# Patient Record
Sex: Female | Born: 1995 | Race: White | Hispanic: Yes | Marital: Single | Smoking: Never smoker
Health system: Southern US, Community
[De-identification: ages and names within clinical notes are randomized; demographics above are authoritative.]

---
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
## 2017-09-27 IMAGING — CR DG FOOT COMPLETE 3+V*R*
1 series · 3 of 3 positions shown · non-contrast
Comparison: 09/07/2012

CLINICAL DATA: Tripping injury while walking down stairs with
popping sensation, lateral foot pain dorsally with bruising and
swelling.

EXAM:
RIGHT FOOT COMPLETE - 3+ VIEW

[Series 1: dg foot complete right · 0.14mm/px · 3 of 3 slices shown]
[im 1/3]
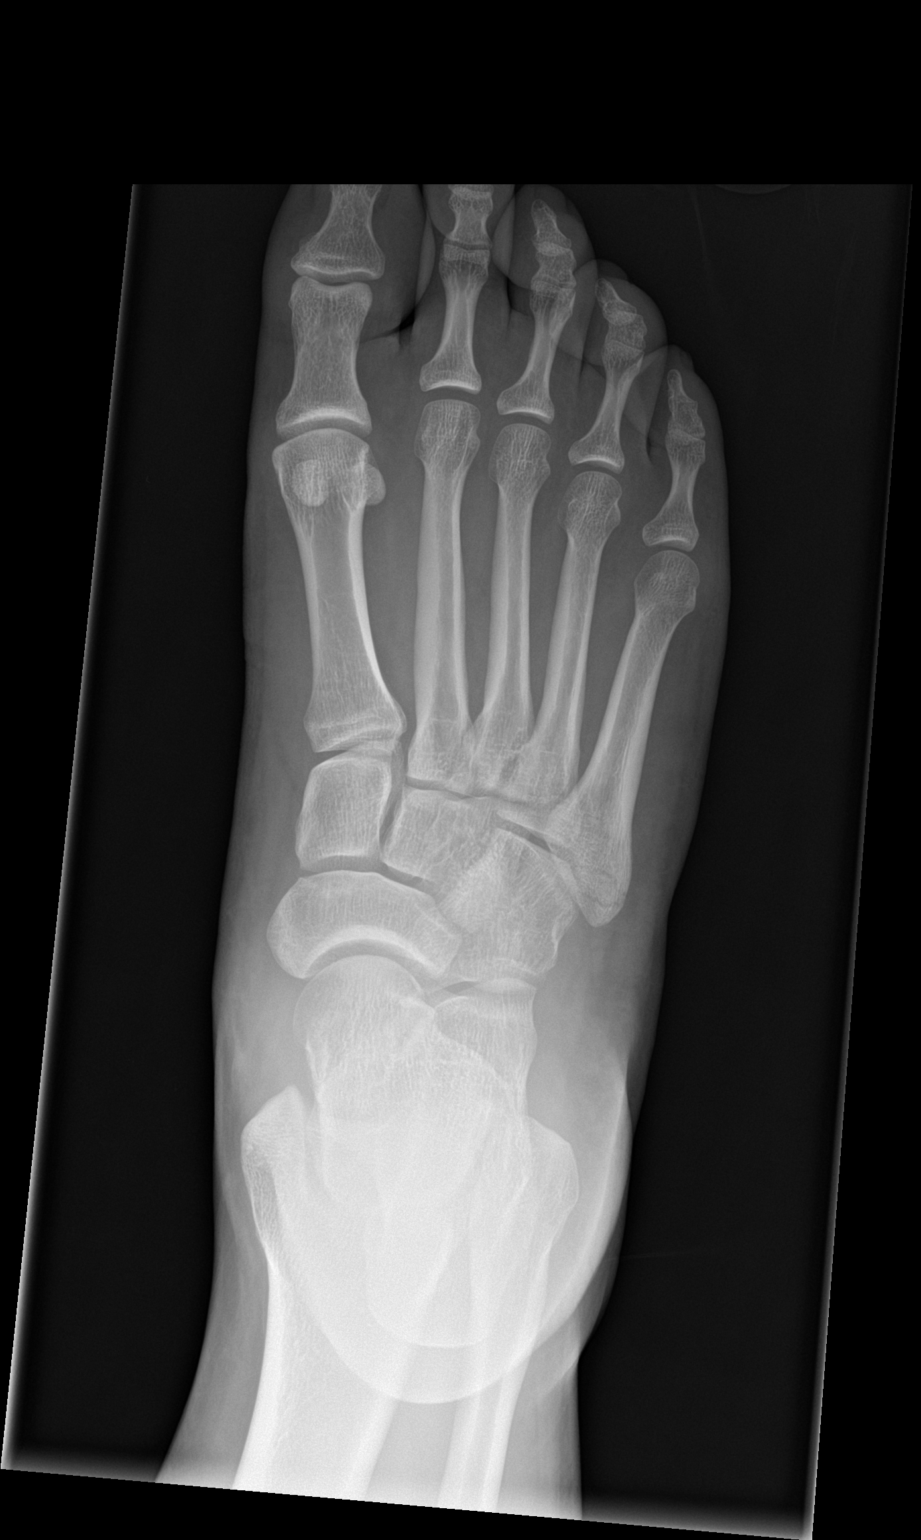
[im 2/3]
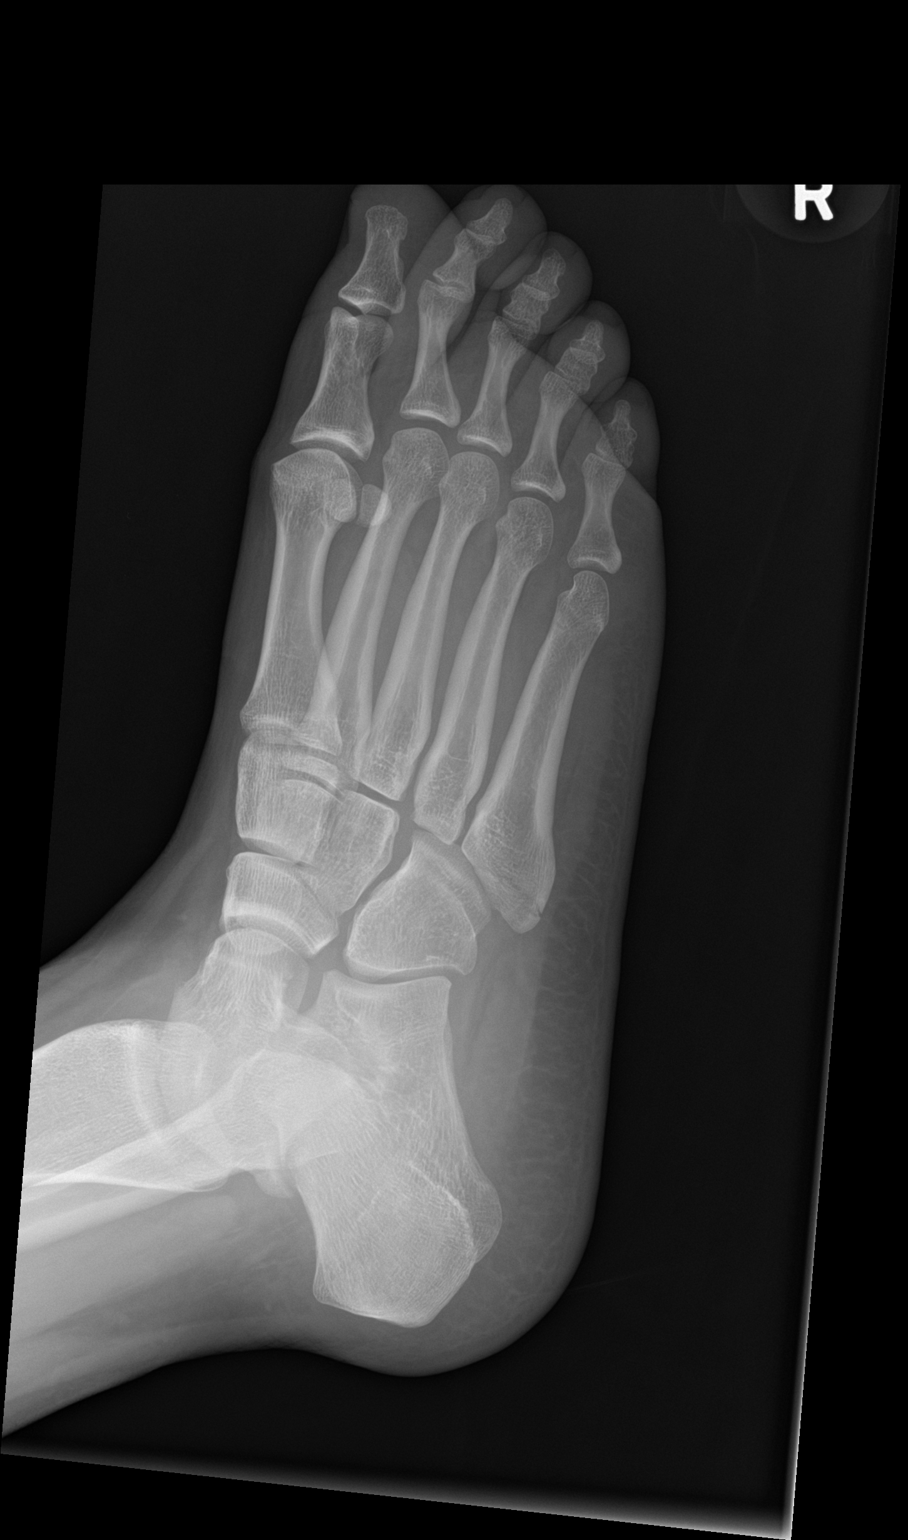
[im 3/3]
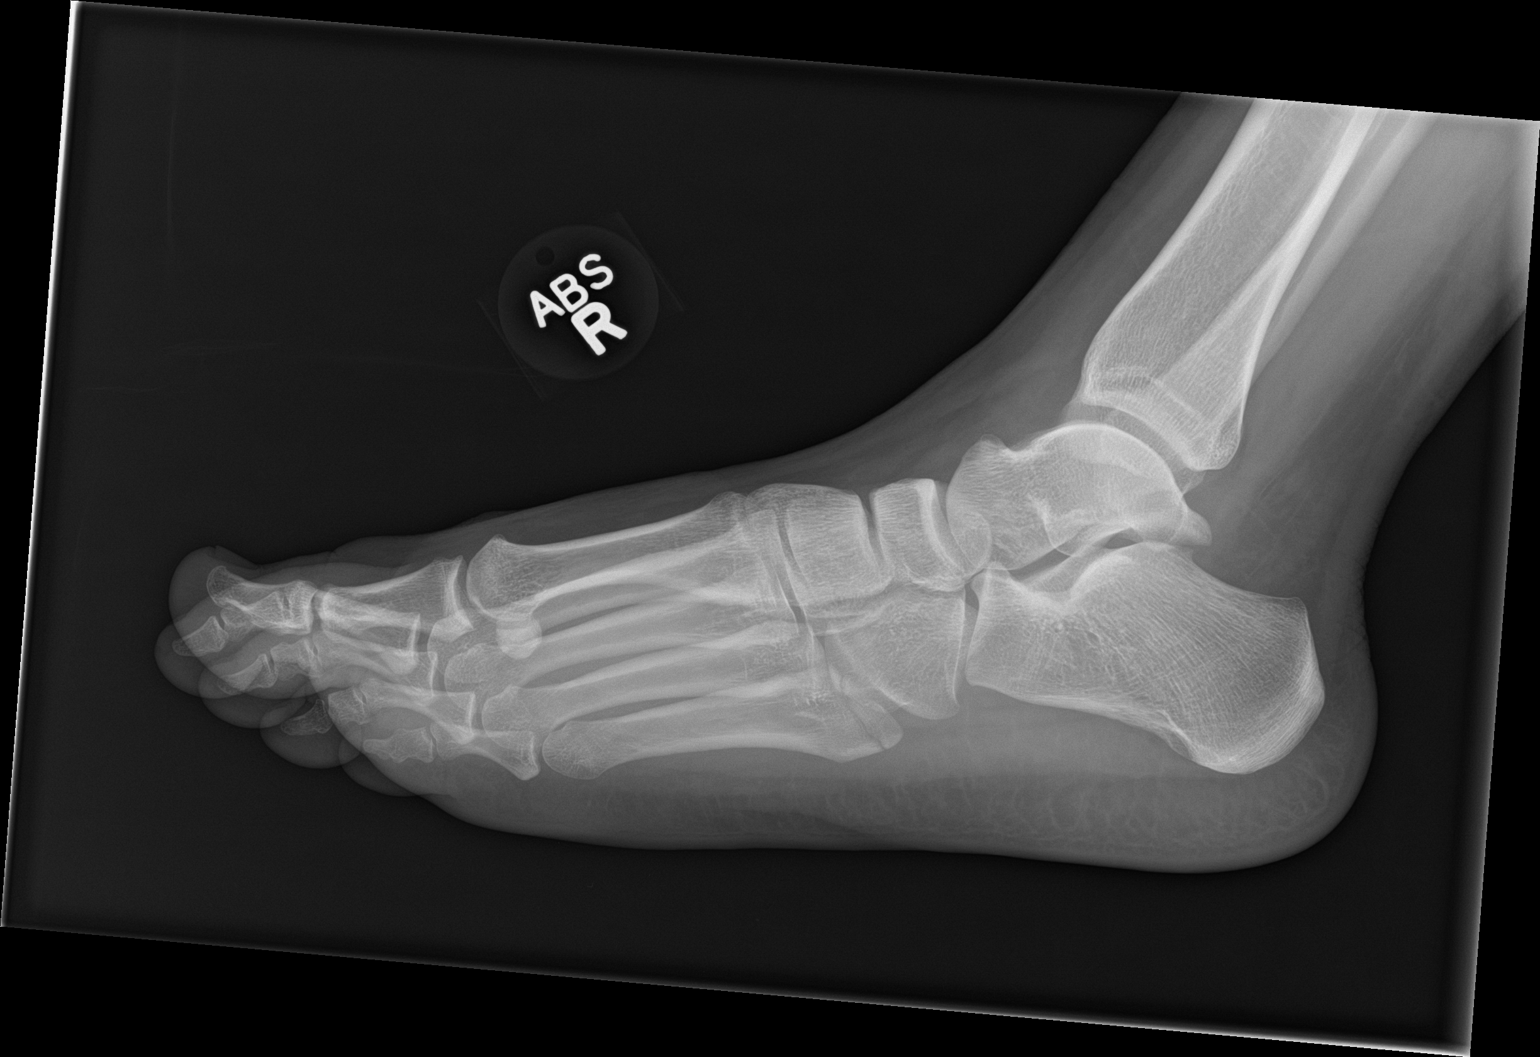

[3 of 3 positions shown; findings below may reference images not displayed]

FINDINGS: There is an acute avulsion fracture of the base of the fifth
metatarsal, nondisplaced. Lisfranc joint alignment normal. No other
fracture observed.
IMPRESSION: 1. Acute transverse avulsion fracture the base of the fifth
metatarsal, nondisplaced.
These results will be called to the ordering clinician or
representative by the Radiologist Assistant, and communication
documented in the PACS or zVision Dashboard.

## 2019-03-16 ENCOUNTER — Telehealth: Payer: Self-pay | Admitting: General Practice

## 2019-03-16 NOTE — Telephone Encounter (Signed)
Tried pt several times to schedule for Covid testing at [facility] and also phone number provided :[phone], unable to lvm to return call. Voicemail isn't set up.

## 2019-11-30 ENCOUNTER — Ambulatory Visit: Payer: Self-pay | Attending: Internal Medicine

## 2019-11-30 ENCOUNTER — Other Ambulatory Visit: Payer: Self-pay

## 2019-11-30 DIAGNOSIS — Z23 Encounter for immunization: Secondary | ICD-10-CM

## 2019-11-30 NOTE — Progress Notes (Signed)
   Covid-19 Vaccination Clinic  Name:  Luther Newhouse    MRN: [id number] DOB: 11-Jun-1996  11/30/2019  Ms. Onstott was observed post Covid-19 immunization for 15 minutes without incident. She was provided with Vaccine Information Sheet and instruction to access the V-Safe system.   Ms. Fishbaugh was instructed to call 911 with any severe reactions post vaccine: Marland Kitchen Difficulty breathing  . Swelling of face and throat  . A fast heartbeat  . A bad rash all over body  . Dizziness and weakness   Immunizations Administered    Name Date Dose VIS Date Route   Pfizer COVID-19 Vaccine 11/30/2019  4:05 PM 0.3 mL 08/24/2019 Intramuscular   Manufacturer: [employer], [employer]   Lot: [id number]   NDC: [address]-0

## 2019-12-23 ENCOUNTER — Ambulatory Visit: Payer: Self-pay | Attending: Internal Medicine

## 2019-12-23 DIAGNOSIS — Z23 Encounter for immunization: Secondary | ICD-10-CM

## 2019-12-23 NOTE — Progress Notes (Signed)
   Covid-19 Vaccination Clinic  Name:  Kristen Morton    MRN: [id number] DOB: [birth year]  12/23/2019  Kristen Morton was observed post Covid-19 immunization for 15 minutes without incident. She was provided with Vaccine Information Sheet and instruction to access the V-Safe system.   Kristen Morton was instructed to call 911 with any severe reactions post vaccine: Marland Kitchen Difficulty breathing  . Swelling of face and throat  . A fast heartbeat  . A bad rash all over body  . Dizziness and weakness   Immunizations Administered    Name Date Dose VIS Date Route   Pfizer COVID-19 Vaccine 12/23/2019  6:25 PM 0.3 mL 08/24/2019 Intramuscular   Manufacturer: [employer], [employer]   Lot: [id number]   NDC: [address]-3

## 2021-09-25 ENCOUNTER — Encounter: Payer: Self-pay | Admitting: Emergency Medicine

## 2021-09-25 ENCOUNTER — Emergency Department
Admission: EM | Admit: 2021-09-25 | Discharge: 2021-09-25 | Disposition: A | Discharge status: Home / Self Care | Payer: Managed Care, Other (non HMO) | Attending: Emergency Medicine | Admitting: Emergency Medicine

## 2021-09-25 ENCOUNTER — Other Ambulatory Visit: Payer: Self-pay

## 2021-09-25 DIAGNOSIS — S86012A Strain of left Achilles tendon, initial encounter: Secondary | ICD-10-CM | POA: Insufficient documentation

## 2021-09-25 DIAGNOSIS — M7918 Myalgia, other site: Secondary | ICD-10-CM

## 2021-09-25 DIAGNOSIS — Y92410 Unspecified street and highway as the place of occurrence of the external cause: Secondary | ICD-10-CM | POA: Diagnosis not present

## 2021-09-25 DIAGNOSIS — S8992XA Unspecified injury of left lower leg, initial encounter: Secondary | ICD-10-CM | POA: Diagnosis present

## 2021-09-25 MED ORDER — ORPHENADRINE CITRATE ER 100 MG PO TB12: 100.0000 mg | ORAL_TABLET | Freq: Two times a day (BID) | ORAL | 0 refills | Status: AC

## 2021-09-25 MED ORDER — NAPROXEN 500 MG PO TABS: 500.0000 mg | ORAL_TABLET | Freq: Two times a day (BID) | ORAL | Status: AC

## 2021-09-25 NOTE — ED Notes (Signed)
Pt gave verbal consent to DC. Computer not working.
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
## 2021-09-25 NOTE — Discharge Instructions (Signed)
Read and follow discharge care instruction take medications as directed. 
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
## 2022-11-24 ENCOUNTER — Emergency Department: Payer: Managed Care, Other (non HMO)

## 2022-11-24 ENCOUNTER — Other Ambulatory Visit: Payer: Self-pay

## 2022-11-24 DIAGNOSIS — Z5321 Procedure and treatment not carried out due to patient leaving prior to being seen by health care provider: Secondary | ICD-10-CM | POA: Diagnosis not present

## 2022-11-24 DIAGNOSIS — Y92410 Unspecified street and highway as the place of occurrence of the external cause: Secondary | ICD-10-CM | POA: Diagnosis not present

## 2022-11-24 DIAGNOSIS — M545 Low back pain, unspecified: Secondary | ICD-10-CM | POA: Insufficient documentation

## 2022-11-24 LAB — POC URINE PREG, ED: Preg Test, Ur: NEGATIVE
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
## 2022-11-25 ENCOUNTER — Emergency Department
Admission: EM | Admit: 2022-11-25 | Discharge: 2022-11-25 | Discharge status: Against Medical Advice | Payer: Managed Care, Other (non HMO) | Attending: Emergency Medicine | Admitting: Emergency Medicine

## 2022-11-25 NOTE — ED Notes (Signed)
No answer when called several times from lobby
